# Patient Record
Sex: Male | Born: 2000 | Race: Black or African American | Hispanic: No | Marital: Single | Smoking: Current every day smoker
Health system: Southern US, Community
[De-identification: ages and names within clinical notes are randomized; demographics above are authoritative.]

---
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
## 2018-08-11 ENCOUNTER — Encounter (HOSPITAL_COMMUNITY): Payer: Self-pay

## 2018-08-11 ENCOUNTER — Emergency Department (HOSPITAL_COMMUNITY)
Admission: EM | Admit: 2018-08-11 | Discharge: 2018-08-12 | Disposition: A | Discharge status: Home / Self Care | Payer: Medicaid Other | Attending: Emergency Medicine | Admitting: Emergency Medicine

## 2018-08-11 DIAGNOSIS — Y92524 Gas station as the place of occurrence of the external cause: Secondary | ICD-10-CM | POA: Diagnosis not present

## 2018-08-11 DIAGNOSIS — Y999 Unspecified external cause status: Secondary | ICD-10-CM | POA: Insufficient documentation

## 2018-08-11 DIAGNOSIS — S01412A Laceration without foreign body of left cheek and temporomandibular area, initial encounter: Secondary | ICD-10-CM | POA: Insufficient documentation

## 2018-08-11 DIAGNOSIS — S0181XA Laceration without foreign body of other part of head, initial encounter: Secondary | ICD-10-CM

## 2018-08-11 DIAGNOSIS — Y939 Activity, unspecified: Secondary | ICD-10-CM | POA: Diagnosis not present

## 2018-08-11 DIAGNOSIS — S01112A Laceration without foreign body of left eyelid and periocular area, initial encounter: Secondary | ICD-10-CM | POA: Diagnosis present
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
## 2018-08-12 MED ORDER — TETANUS-DIPHTH-ACELL PERTUSSIS 5-2.5-18.5 LF-MCG/0.5 IM SUSP
0.5000 mL | Freq: Once | INTRAMUSCULAR | Status: AC
  Administered 2018-08-12: 0.5 mL via INTRAMUSCULAR
  Filled 2018-08-12: qty 0.5

## 2018-08-12 MED ORDER — LIDOCAINE-EPINEPHRINE (PF) 2 %-1:200000 IJ SOLN
20.0000 mL | Freq: Once | INTRAMUSCULAR | Status: AC
  Administered 2018-08-12: 20 mL
  Filled 2018-08-12: qty 20

## 2018-08-12 NOTE — Discharge Instructions (Addendum)
Keep wounds clean and watch for signs of infection such as spreading redness, pus draining, fevers. Use Tylenol, Motrin and ice as needed for pain and swelling.  Your swelling will likely be worse tomorrow and then should gradually get better. You can shower however be gentle around the stitches.  No baths or swimming pool until healed.
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
## 2018-11-30 IMAGING — CR DG CERVICAL SPINE COMPLETE 4+V
6 series · 6 of 6 positions shown · non-contrast
Comparison: None.

CLINICAL DATA: Back and neck pain. Heavy object dropped on back of
neck yesterday.

EXAM:
CERVICAL SPINE - COMPLETE 4+ VIEW

[c-spine lat]
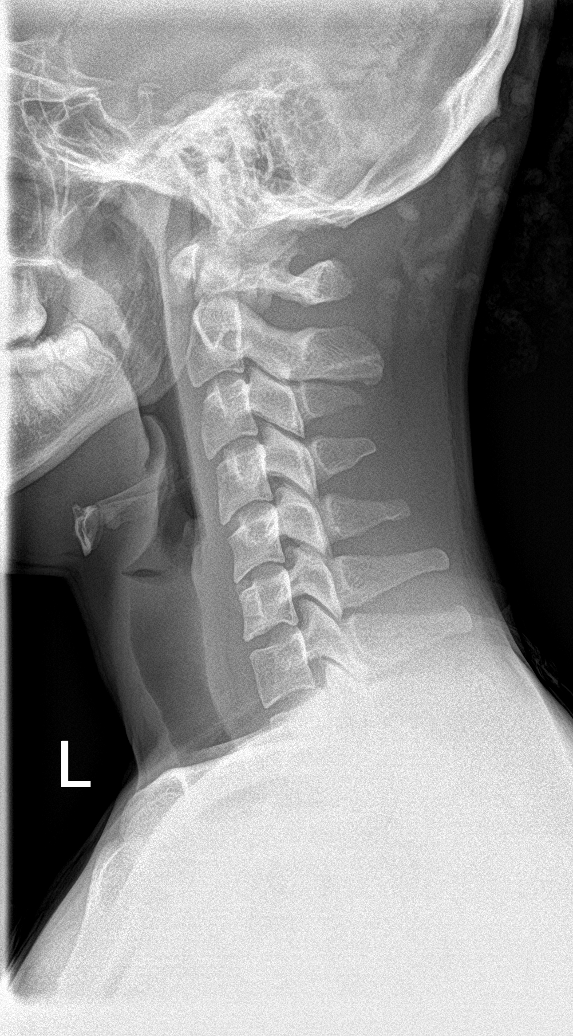

[c-spine obl (1 of 3)]
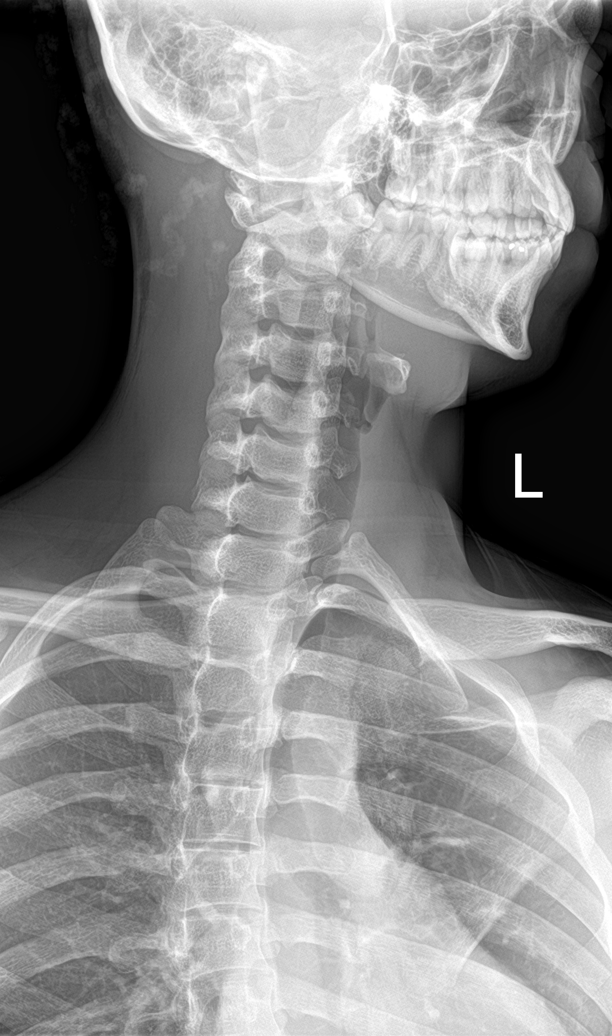

[c-spine obl (2 of 3)]
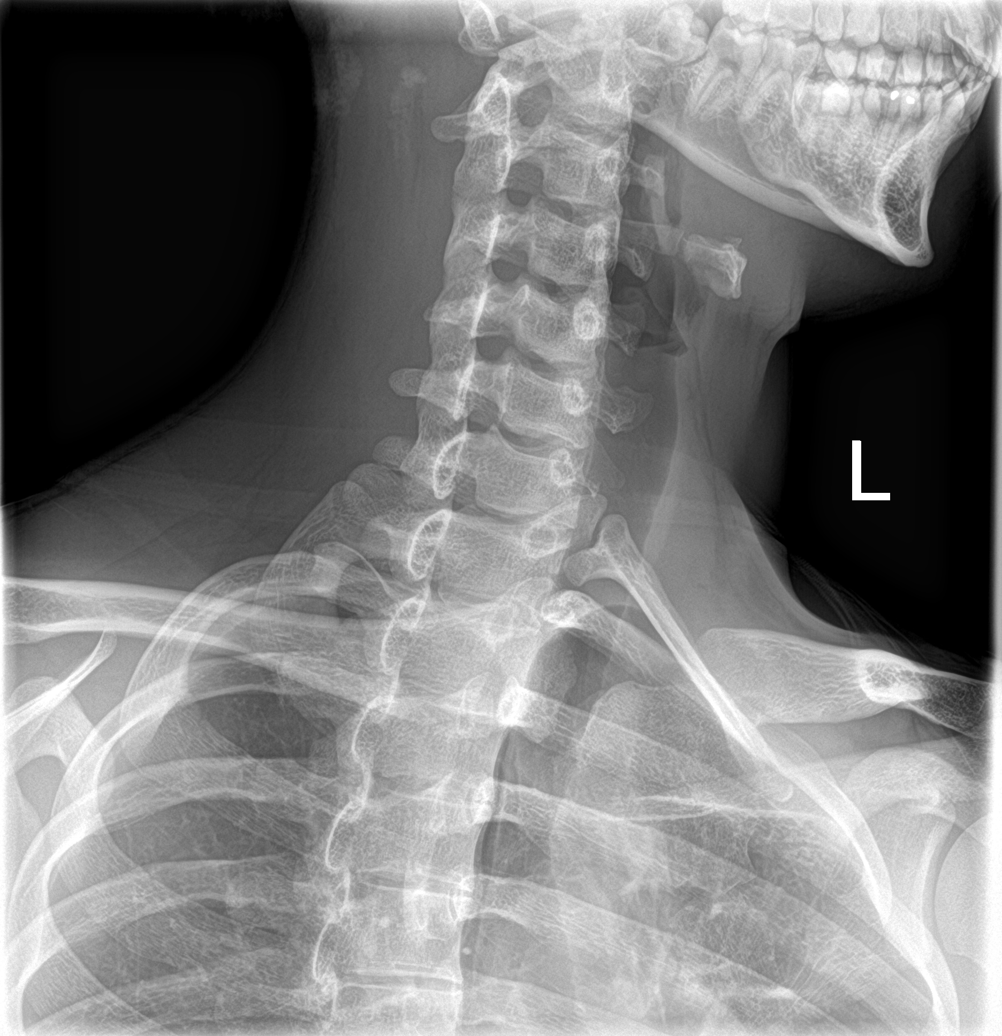

[c-spine ap]
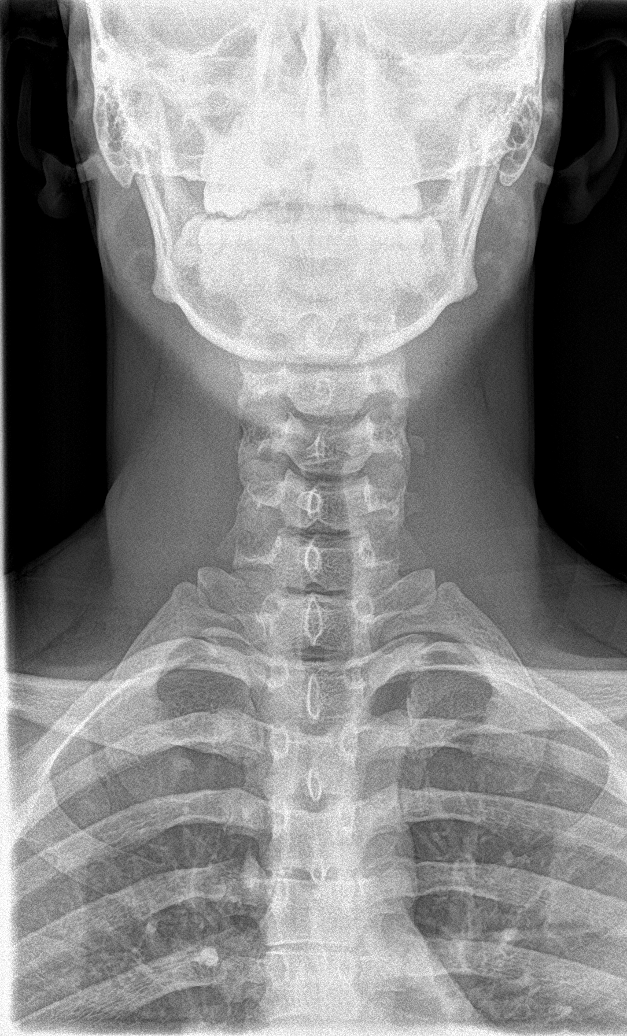

[c-spine obl (3 of 3)]
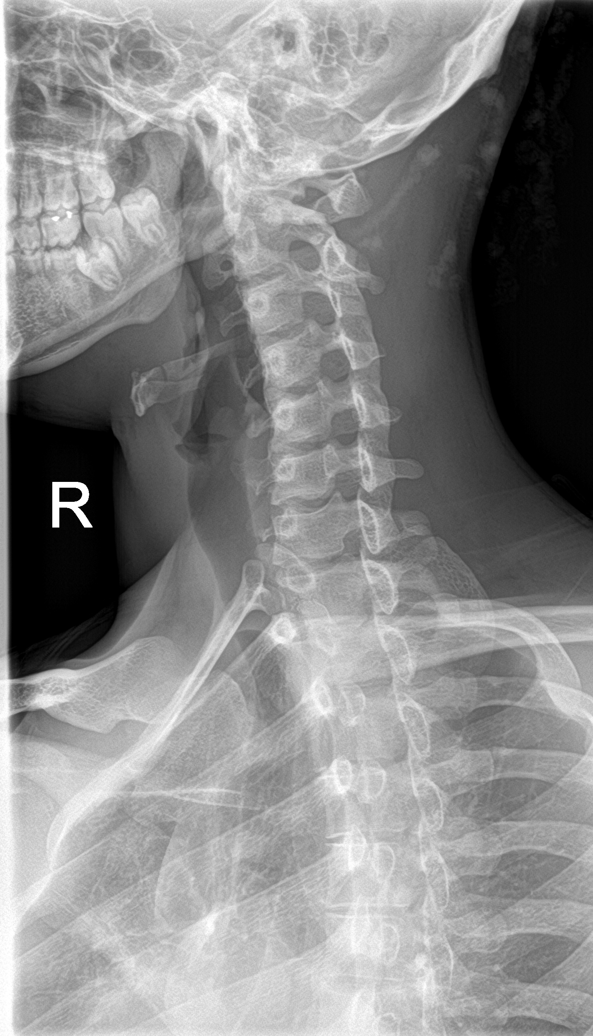

[c-spine open mouth]
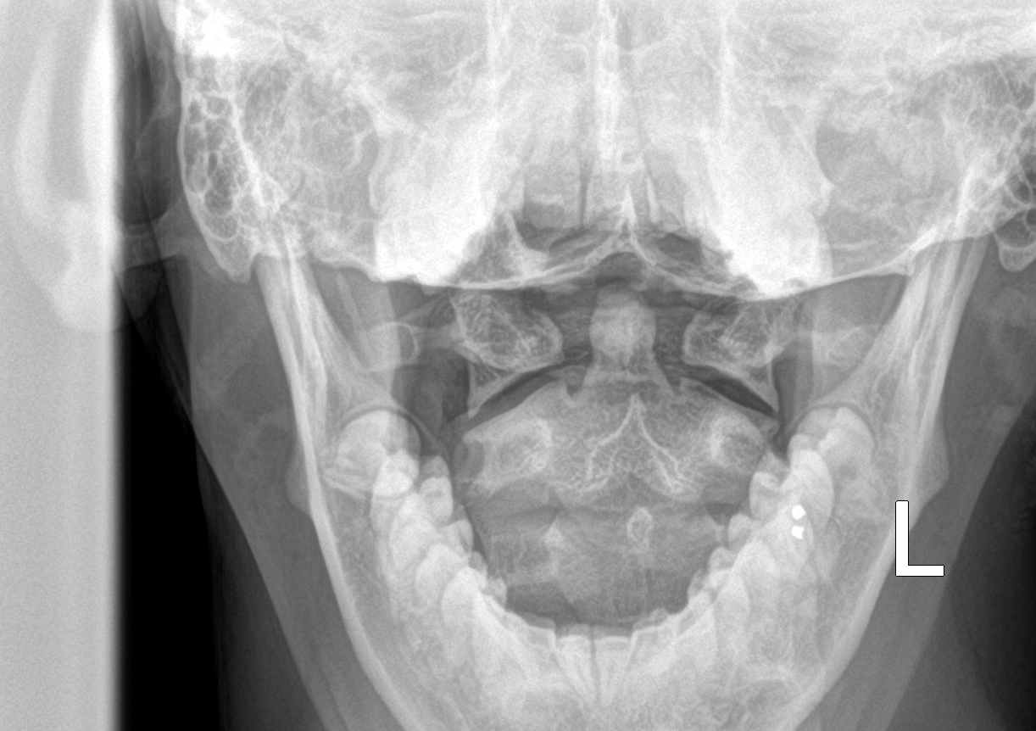

[6 of 6 positions shown; findings below may reference images not displayed]

FINDINGS: There is no evidence of cervical spine fracture or prevertebral soft
tissue swelling. Alignment is normal. No other significant bone
abnormalities are identified.
IMPRESSION: Negative cervical spine radiographs.

## 2019-07-15 ENCOUNTER — Other Ambulatory Visit: Payer: Medicaid Other

## 2020-04-20 ENCOUNTER — Other Ambulatory Visit: Payer: Self-pay

## 2020-04-20 ENCOUNTER — Ambulatory Visit (HOSPITAL_COMMUNITY)
Admission: EM | Admit: 2020-04-20 | Discharge: 2020-04-20 | Disposition: A | Discharge status: Home / Self Care | Payer: Medicaid Other | Attending: Family Medicine | Admitting: Family Medicine

## 2020-04-20 ENCOUNTER — Encounter (HOSPITAL_COMMUNITY): Payer: Self-pay | Admitting: *Deleted

## 2020-04-20 DIAGNOSIS — Z202 Contact with and (suspected) exposure to infections with a predominantly sexual mode of transmission: Secondary | ICD-10-CM | POA: Insufficient documentation

## 2020-04-20 MED ORDER — AZITHROMYCIN 250 MG PO TABS
ORAL_TABLET | ORAL | Status: AC
  Filled 2020-04-20: qty 4

## 2020-04-20 MED ORDER — AZITHROMYCIN 250 MG PO TABS
1000.0000 mg | ORAL_TABLET | Freq: Once | ORAL | Status: AC
  Administered 2020-04-20: 17:00:00 1000 mg via ORAL

## 2020-04-20 NOTE — ED Provider Notes (Signed)
[facility]   [id number] 04/20/20 Arrival Time: 1332  ASSESSMENT & PLAN:  1. Exposure to STD    Declines empiric gonorrhea treatment. Declines HIV/RPR>   Discharge Instructions      You have been given the following today for treatment of possible chlamydia:  Meds ordered this encounter  Medications  . azithromycin (ZITHROMAX) tablet 1,000 mg     Even though we have treated you today, we have sent testing for sexually transmitted infections. We will notify you of any positive results once they are received. If required, we will prescribe any medications you might need.  Please refrain from all sexual activity for at least the next seven days.     Pending: Labs Reviewed  CYTOLOGY, (ORAL, ANAL, URETHRAL) ANCILLARY ONLY    Will notify of any positive results. Instructed to refrain from sexual activity for at least seven days.  Reviewed expectations re: course of current medical issues. Questions answered. Outlined signs and symptoms indicating need for more acute intervention. Patient verbalized understanding. After Visit Summary given.   SUBJECTIVE:  Nathan Gardner is a 19 y.o. male who reports possible exposure to chlamydia. Male sexual partner recently tested positive. He reports no symptoms except for "maybe a little itching" of penis. Afebrile. Normal urination.   OBJECTIVE:  Vitals:   04/20/20 1636  BP: 121/70  Pulse: (!) 56  Resp: 16  Temp: 98.4 F (36.9 C)  TempSrc: Oral  SpO2: 98%     General appearance: alert, cooperative, appears stated age and no distress Lungs: unlabored respirations; speaks full sentences without difficulty Abdomen: soft, non-tender GU: deferred Skin: warm and dry Psychological: alert and cooperative; normal mood and affect.  No results found for this or any previous visit.  Labs Reviewed  CYTOLOGY, (ORAL, ANAL, URETHRAL) ANCILLARY ONLY    Allergies  Allergen Reactions  . Shellfish Allergy Itching     Throat irritation    History reviewed. No pertinent past medical history. Family History  Problem Relation Age of Onset  . Healthy Mother   . Healthy Father    Social History   Socioeconomic History  . Marital status: Single    Spouse name: Not on file  . Number of children: Not on file  . Years of education: Not on file  . Highest education level: Not on file  Occupational History  . Not on file  Tobacco Use  . Smoking status: Current Every Day Smoker    Packs/day: 0.50    Types: Cigarettes, Cigars  . Smokeless tobacco: Never Used  Vaping Use  . Vaping Use: Former  Substance and Sexual Activity  . Alcohol use: Yes    Comment: occ  . Drug use: No  . Sexual activity: Yes  Other Topics Concern  . Not on file  Social History Narrative  . Not on file   Social Determinants of Health   Financial Resource Strain:   . Difficulty of Paying Living Expenses: Not on file  Food Insecurity:   . Worried About Programme researcher, broadcasting/film/video in the Last Year: Not on file  . Ran Out of Food in the Last Year: Not on file  Transportation Needs:   . Lack of Transportation (Medical): Not on file  . Lack of Transportation (Non-Medical): Not on file  Physical Activity:   . Days of Exercise per Week: Not on file  . Minutes of Exercise per Session: Not on file  Stress:   . Feeling of Stress : Not on file  Social  Connections:   . Frequency of Communication with Friends and Family: Not on file  . Frequency of Social Gatherings with Friends and Family: Not on file  . Attends Religious Services: Not on file  . Active Member of Clubs or Organizations: Not on file  . Attends Banker Meetings: Not on file  . Marital Status: Not on file  Intimate Partner Violence:   . Fear of Current or Ex-Partner: Not on file  . Emotionally Abused: Not on file  . Physically Abused: Not on file  . Sexually Abused: Not on file          Mardella Layman, MD 04/20/20 1730

## 2020-04-20 NOTE — Discharge Instructions (Signed)
You have been given the following today for treatment of possible chlamydia:  Meds ordered this encounter  Medications   azithromycin (ZITHROMAX) tablet 1,000 mg    Even though we have treated you today, we have sent testing for sexually transmitted infections. We will notify you of any positive results once they are received. If required, we will prescribe any medications you might need.  Please refrain from all sexual activity for at least the next seven days.  

## 2020-04-20 NOTE — ED Triage Notes (Signed)
Patient does not have a cell phone. Patient's mom can be called at  [phone] in order to reach the patient.

## 2020-04-20 NOTE — ED Triage Notes (Signed)
Patient in requesting STD testing. Patient states that he experienced some itching to penis for the past couple of days. Patient thought it could be due to new hair growing back after shaving. Patient states that his girlfriend tested positive for chlamydia and found out on yesterday. Patient denies any fever or penile discharge.

## 2020-04-22 LAB — CYTOLOGY, (ORAL, ANAL, URETHRAL) ANCILLARY ONLY
Chlamydia: POSITIVE — AB
Comment: NEGATIVE
Comment: NEGATIVE
Comment: NORMAL
Neisseria Gonorrhea: NEGATIVE
Trichomonas: NEGATIVE

## 2020-05-09 ENCOUNTER — Telehealth (HOSPITAL_COMMUNITY): Payer: Self-pay | Admitting: Emergency Medicine

## 2020-05-09 NOTE — Telephone Encounter (Signed)
Patient returned call for results after receiving letter.  Updated phone number in chart.  Patient updated on recent STI results.  Reviewed safe sex practices, notifying partners.  Patient verified understanding, all questions answered.

## 2023-04-14 ENCOUNTER — Emergency Department (HOSPITAL_COMMUNITY): Payer: Medicaid Other

## 2023-04-14 ENCOUNTER — Other Ambulatory Visit: Payer: Self-pay

## 2023-04-14 ENCOUNTER — Emergency Department (HOSPITAL_COMMUNITY)
Admission: EM | Admit: 2023-04-14 | Discharge: 2023-04-14 | Disposition: A | Discharge status: Home / Self Care | Payer: Medicaid Other | Attending: Emergency Medicine | Admitting: Emergency Medicine

## 2023-04-14 ENCOUNTER — Encounter (HOSPITAL_COMMUNITY): Payer: Self-pay | Admitting: Emergency Medicine

## 2023-04-14 DIAGNOSIS — E860 Dehydration: Secondary | ICD-10-CM | POA: Diagnosis not present

## 2023-04-14 DIAGNOSIS — X58XXXA Exposure to other specified factors, initial encounter: Secondary | ICD-10-CM | POA: Diagnosis not present

## 2023-04-14 DIAGNOSIS — F10120 Alcohol abuse with intoxication, uncomplicated: Secondary | ICD-10-CM | POA: Insufficient documentation

## 2023-04-14 DIAGNOSIS — Y907 Blood alcohol level of 200-239 mg/100 ml: Secondary | ICD-10-CM | POA: Diagnosis not present

## 2023-04-14 DIAGNOSIS — S0990XA Unspecified injury of head, initial encounter: Secondary | ICD-10-CM | POA: Insufficient documentation

## 2023-04-14 DIAGNOSIS — F10920 Alcohol use, unspecified with intoxication, uncomplicated: Secondary | ICD-10-CM

## 2023-04-14 LAB — COMPREHENSIVE METABOLIC PANEL
ALT: 23 U/L (ref 0–44)
AST: 38 U/L (ref 15–41)
Albumin: 4.3 g/dL (ref 3.5–5.0)
Alkaline Phosphatase: 66 U/L (ref 38–126)
Anion gap: 8 (ref 5–15)
BUN: 13 mg/dL (ref 6–20)
CO2: 26 mmol/L (ref 22–32)
Calcium: 9.4 mg/dL (ref 8.9–10.3)
Chloride: 106 mmol/L (ref 98–111)
Creatinine, Ser: 1.17 mg/dL (ref 0.61–1.24)
GFR, Estimated: >60 mL/min (ref >60)
Glucose, Bld: 96 mg/dL (ref 70–99)
Potassium: 4.9 mmol/L (ref 3.5–5.1)
Sodium: 140 mmol/L (ref 135–145)
Total Bilirubin: 0.7 mg/dL (ref 0.3–1.2)
Total Protein: 7.2 g/dL (ref 6.5–8.1)

## 2023-04-14 LAB — RAPID URINE DRUG SCREEN, HOSP PERFORMED
Amphetamines: NOT DETECTED
Barbiturates: NOT DETECTED
Benzodiazepines: NOT DETECTED
Cocaine: NOT DETECTED
Opiates: NOT DETECTED
Tetrahydrocannabinol: POSITIVE — AB

## 2023-04-14 LAB — URINALYSIS, ROUTINE W REFLEX MICROSCOPIC
Bilirubin Urine: NEGATIVE
Glucose, UA: NEGATIVE mg/dL
Hgb urine dipstick: NEGATIVE
Ketones, ur: NEGATIVE mg/dL
Leukocytes,Ua: NEGATIVE
Nitrite: NEGATIVE
Protein, ur: NEGATIVE mg/dL
Specific Gravity, Urine: 1.02 (ref 1.005–1.030)
pH: 5.5 (ref 5.0–8.0)

## 2023-04-14 LAB — CBC WITH DIFFERENTIAL/PLATELET
Abs Immature Granulocytes: 0.01 10*3/uL (ref 0.00–0.07)
Basophils Absolute: 0 10*3/uL (ref 0.0–0.1)
Basophils Relative: 0 %
Eosinophils Absolute: 0 10*3/uL (ref 0.0–0.5)
Eosinophils Relative: 1 %
HCT: 44.6 % (ref 39.0–52.0)
Hemoglobin: 14 g/dL (ref 13.0–17.0)
Immature Granulocytes: 0 %
Lymphocytes Relative: 43 %
Lymphs Abs: 1.5 10*3/uL (ref 0.7–4.0)
MCH: 25 pg — ABNORMAL LOW (ref 26.0–34.0)
MCHC: 31.4 g/dL (ref 30.0–36.0)
MCV: 79.6 fL — ABNORMAL LOW (ref 80.0–100.0)
Monocytes Absolute: 0.3 10*3/uL (ref 0.1–1.0)
Monocytes Relative: 8 %
Neutro Abs: 1.6 10*3/uL — ABNORMAL LOW (ref 1.7–7.7)
Neutrophils Relative %: 48 %
Platelets: 188 10*3/uL (ref 150–400)
RBC: 5.6 MIL/uL (ref 4.22–5.81)
RDW: 11.6 % (ref 11.5–15.5)
WBC: 3.4 10*3/uL — ABNORMAL LOW (ref 4.0–10.5)
nRBC: 0 % (ref 0.0–0.2)

## 2023-04-14 LAB — ETHANOL: Alcohol, Ethyl (B): 235 mg/dL — ABNORMAL HIGH (ref <10)

## 2023-04-14 LAB — LIPASE, BLOOD: Lipase: 23 U/L (ref 11–51)

## 2023-04-14 MED ORDER — LACTATED RINGERS IV BOLUS
2000.0000 mL | Freq: Once | INTRAVENOUS | Status: AC
  Administered 2023-04-14: 2000 mL via INTRAVENOUS

## 2023-04-14 MED ORDER — ONDANSETRON HCL 4 MG PO TABS: 4.0000 mg | ORAL_TABLET | Freq: Three times a day (TID) | ORAL | 0 refills | Status: AC | PRN

## 2023-04-14 MED ORDER — KETOROLAC TROMETHAMINE 15 MG/ML IJ SOLN
15.0000 mg | Freq: Once | INTRAMUSCULAR | Status: AC
  Administered 2023-04-14: 15 mg via INTRAVENOUS
  Filled 2023-04-14: qty 1

## 2023-04-14 MED ORDER — ONDANSETRON HCL 4 MG/2ML IJ SOLN
4.0000 mg | Freq: Once | INTRAMUSCULAR | Status: AC
  Administered 2023-04-14: 4 mg via INTRAVENOUS
  Filled 2023-04-14: qty 2

## 2023-04-14 MED ORDER — ACETAMINOPHEN 500 MG PO TABS: 1000.0000 mg | ORAL_TABLET | Freq: Once | ORAL | Status: DC

## 2023-04-14 NOTE — ED Notes (Signed)
Let me know when family is at bedside please   Tonette Lederer, PA-C 04/14/23 5621

## 2023-04-14 NOTE — ED Notes (Signed)
Pt stood and ambulated well. Pt returned to be safely.

## 2023-04-14 NOTE — ED Provider Notes (Signed)
[place] EMERGENCY DEPARTMENT AT [facility] Provider Note   CSN: [id number] Arrival date & time: 04/14/23  6387     History  Chief Complaint  Patient presents with   Alcohol Intoxication    Nathan Gardner is a 22 y.o. male with no past medical history who presents to the ED due to alcohol intoxication.  Patient brought to the ED for uncontrollable nausea and vomiting.  Reports that he drank half of a handle of an unknown liquor throughout the night.  Denies pain on initial assessment but history limited secondary to acute alcohol intoxication.  States that he was feeling well prior to alcohol consumption.       Home Medications No daily medications  Allergies    Shellfish allergy    Review of Systems   Review of Systems  Unable to perform ROS: Other    Physical Exam Updated Vital Signs BP 112/65   Pulse (!) 56   Temp 98.5 F (36.9 C)   Resp 17   Ht 5\' 4"  (1.626 m)   Wt 64.4 kg   SpO2 100%   BMI 24.37 kg/m  Physical Exam Vitals and nursing note reviewed.  Constitutional:      General: He is not in acute distress.    Appearance: He is not toxic-appearing or diaphoretic.     Comments: Smells heavily of vodka  HENT:     Head: Normocephalic and atraumatic.     Mouth/Throat:     Mouth: Mucous membranes are dry.  Eyes:     General: No scleral icterus.    Extraocular Movements: Extraocular movements intact.     Conjunctiva/sclera: Conjunctivae normal.     Pupils: Pupils are equal, round, and reactive to light.  Cardiovascular:     Rate and Rhythm: Normal rate and regular rhythm.     Heart sounds: No murmur heard. Pulmonary:     Effort: Pulmonary effort is normal. No respiratory distress.     Breath sounds: Normal breath sounds.  Abdominal:     General: Abdomen is flat. There is no distension.     Palpations: Abdomen is soft.     Tenderness: There is no abdominal tenderness. There is no right CVA tenderness, left CVA tenderness, guarding or  rebound.  Musculoskeletal:        General: Normal range of motion.     Cervical back: Neck supple.     Right lower leg: No edema.     Left lower leg: No edema.  Skin:    General: Skin is warm and dry.     Capillary Refill: Capillary refill takes less than 2 seconds.     Coloration: Skin is not jaundiced or pale.     Findings: No rash.  Neurological:     Mental Status: He is alert.     Comments: Alert but slow to respond to questions though does answer most appropriately, no focal deficits  Psychiatric:        Mood and Affect: Affect is flat.        Speech: Speech is delayed.        Behavior: Behavior is cooperative.     ED Results / Procedures / Treatments   Labs (all labs ordered are listed, but only abnormal results are displayed) Labs Reviewed  CBC WITH DIFFERENTIAL/PLATELET - Abnormal; Notable for the following components:      Result Value   WBC 3.4 (*)    MCV 79.6 (*)    MCH 25.0 (*)  Neutro Abs 1.6 (*)    All other components within normal limits  RAPID URINE DRUG SCREEN, HOSP PERFORMED - Abnormal; Notable for the following components:   Tetrahydrocannabinol POSITIVE (*)    All other components within normal limits  ETHANOL - Abnormal; Notable for the following components:   Alcohol, Ethyl (B) 235 (*)    All other components within normal limits  COMPREHENSIVE METABOLIC PANEL  URINALYSIS, ROUTINE W REFLEX MICROSCOPIC  LIPASE, BLOOD    EKG EKG Interpretation Date/Time:  Sunday April 14 2023 09:39:03 EDT Ventricular Rate:  74 PR Interval:  140 QRS Duration:  84 QT Interval:  372 QTC Calculation: 412 R Axis:   73  Text Interpretation: Normal sinus rhythm with sinus arrhythmia Normal ECG No previous ECGs available Confirmed by Kristine Royal [phone]) on 04/14/2023 10:16:35 AM  Radiology DG Chest 2 View  Result Date: 04/14/2023 CLINICAL DATA:  Altered mental status.  Alcohol intoxication. EXAM: CHEST - 2 VIEW COMPARISON:  None Available. FINDINGS: The  heart size and mediastinal contours are within normal limits. Both lungs are clear. The visualized skeletal structures are unremarkable. IMPRESSION: No active cardiopulmonary disease. Electronically Signed   By: Kennith Center M.D.   On: 04/14/2023 11:28   CT Head Wo Contrast  Result Date: 04/14/2023 CLINICAL DATA:  Neck trauma, dangerous injury mechanism (Age 79-64y); Head trauma, moderate-severe Mental status change, unknown cause EXAM: CT HEAD WITHOUT CONTRAST TECHNIQUE: Contiguous axial images were obtained from the base of the skull through the vertex without intravenous contrast. RADIATION DOSE REDUCTION: This exam was performed according to the departmental dose-optimization program which includes automated exposure control, adjustment of the mA and/or kV according to patient size and/or use of iterative reconstruction technique. COMPARISON:  None Available. FINDINGS: Brain: No evidence of acute infarction, hemorrhage, hydrocephalus, extra-axial collection or mass lesion/mass effect. Vascular: No hyperdense vessel or unexpected calcification. Skull: Normal. Negative for fracture or focal lesion. Sinuses/Orbits: Mucoperiosteal thickening noted consistent with chronic bilateral maxillary sinusitis. IMPRESSION: No acute intracranial process. EXAM: CT CERVICAL SPINE WITHOUT CONTRAST TECHNIQUE: Multidetector CT imaging of the cervical spine was performed without intravenous contrast. Multiplanar CT image reconstructions were also generated. RADIATION DOSE REDUCTION: This exam was performed according to the departmental dose-optimization program which includes automated exposure control, adjustment of the mA and/or kV according to patient size and/or use of iterative reconstruction technique. COMPARISON:  None Available. FINDINGS: Alignment: Normal. Skull base and vertebrae: No acute fracture. No primary bone lesion or focal pathologic process. Soft tissues and spinal canal: No prevertebral fluid or swelling. No  visible canal hematoma. Disc levels:  Maintained Upper chest: Negative. IMPRESSION: Unremarkable study. Electronically Signed   By: Layla Maw M.D.   On: 04/14/2023 10:45   CT Cervical Spine Wo Contrast  Result Date: 04/14/2023 CLINICAL DATA:  Neck trauma, dangerous injury mechanism (Age 65-64y); Head trauma, moderate-severe Mental status change, unknown cause EXAM: CT HEAD WITHOUT CONTRAST TECHNIQUE: Contiguous axial images were obtained from the base of the skull through the vertex without intravenous contrast. RADIATION DOSE REDUCTION: This exam was performed according to the departmental dose-optimization program which includes automated exposure control, adjustment of the mA and/or kV according to patient size and/or use of iterative reconstruction technique. COMPARISON:  None Available. FINDINGS: Brain: No evidence of acute infarction, hemorrhage, hydrocephalus, extra-axial collection or mass lesion/mass effect. Vascular: No hyperdense vessel or unexpected calcification. Skull: Normal. Negative for fracture or focal lesion. Sinuses/Orbits: Mucoperiosteal thickening noted consistent with chronic bilateral maxillary sinusitis. IMPRESSION: No acute intracranial process.  EXAM: CT CERVICAL SPINE WITHOUT CONTRAST TECHNIQUE: Multidetector CT imaging of the cervical spine was performed without intravenous contrast. Multiplanar CT image reconstructions were also generated. RADIATION DOSE REDUCTION: This exam was performed according to the departmental dose-optimization program which includes automated exposure control, adjustment of the mA and/or kV according to patient size and/or use of iterative reconstruction technique. COMPARISON:  None Available. FINDINGS: Alignment: Normal. Skull base and vertebrae: No acute fracture. No primary bone lesion or focal pathologic process. Soft tissues and spinal canal: No prevertebral fluid or swelling. No visible canal hematoma. Disc levels:  Maintained Upper chest:  Negative. IMPRESSION: Unremarkable study. Electronically Signed   By: Layla Maw M.D.   On: 04/14/2023 10:45    Procedures Procedures    Medications Ordered in ED Medications  acetaminophen (TYLENOL) tablet 1,000 mg (1,000 mg Oral Not Given 04/14/23 1257)  lactated ringers bolus 2,000 mL (0 mLs Intravenous Stopped 04/14/23 1354)  ondansetron (ZOFRAN) injection 4 mg (4 mg Intravenous Given 04/14/23 0950)  ketorolac (TORADOL) 15 MG/ML injection 15 mg (15 mg Intravenous Given 04/14/23 1257)    ED Course/ Medical Decision Making/ A&P Clinical Course as of 04/14/23 1434  Sun Apr 14, 2023  9147 Patient told nursing staff that he is not here for vomiting.  Denies any episodes of vomiting.  Stated that he is "strong" and denies any acute complaints.  Stated that he was unsure why he was brought to the ED.  Family on the way.  Will continue to reassess and attempt to obtain further history from family once at bedside. [MG]  90 Mother and grandmother are now at bedside.  They state that patient was at his partner's house last night and drinking.  State that they were contacted in the middle of the night stating that patient was drunk and vomiting.  They went to pick him up this morning and found patient lying in the bathtub, confused, not behaving like himself, not able to walk.  State that they are concerned that something else is going on apart from alcohol intoxication.  States that they were told by patient's partner that sometime during the night he fell down and hit his head.  [MG]  1125 Patient much more alert, answering questions appropriately.  Complaining of a headache.  Suspect from hangover.  CT brain and C-spine normal.  Thus far labs unremarkable.  Brother at bedside.  Discussed with reassuring workup likelihood patient can be discharged once he metabolizes to freedom and patient and brother are agreeable.  Pending chest x-ray. [MG]  1340 Patient rechecked.  Family at bedside.  Still  feeling sleepy but states that lightheadedness has resolved.  No active emesis in the ED.  Has received over a liter of fluids.  Tolerating p.o.  Will have nursing staff attempt to ambulate and if patient able to ambulate okay will discharge home in the care of family. [MG]    Clinical Course User Index [MG] Tonette Lederer, PA-C                                Medical Decision Making Amount and/or Complexity of Data Reviewed Labs: ordered. Decision-making details documented in ED Course. Radiology: ordered.  Risk OTC drugs. Prescription drug management.   Medical Decision Making:   Zacheri Dalal is a 22 y.o. male who presented to the ED today with alcohol intoxication detailed above.     Complete initial physical exam performed, notably  the patient was in no acute distress, nontoxic-appearing.  Abdomen soft nontender.  Moderately dry mucous membranes. Reviewed and confirmed nursing documentation for past medical history, family history, social history.    Initial Assessment:   With the patient's presentation, differential diagnosis includes but is not limited to alcohol intoxication, dehydration, AKI, electrolyte disturbance, viral syndrome, psychiatric emergency.  This is most consistent with an acute complicated illness  Initial Plan:  Screening labs including CBC and Metabolic panel to evaluate for infectious or metabolic etiology of disease.  Urinalysis with reflex culture ordered to evaluate for UTI or relevant urologic/nephrologic pathology.  Lipase to evaluate for pancreatitis EKG to evaluate for cardiac pathology UDS and ethanol level to assess for acute intoxication Symptomatic management with fluids, antiemetics Objective evaluation as below reviewed   Initial Study Results:   Laboratory  All laboratory results reviewed without evidence of clinically relevant pathology.   Exceptions include: Alcohol 235, UDS positive for THC  EKG EKG was reviewed independently.   Normal sinus rhythm with sinus arrhythmia.  Normal intervals.  No acute ST-T changes.  No STEMI.  Normal EKG.  Radiology:  All images reviewed independently. Agree with radiology report at this time.   DG Chest 2 View  Result Date: 04/14/2023 CLINICAL DATA:  Altered mental status.  Alcohol intoxication. EXAM: CHEST - 2 VIEW COMPARISON:  None Available. FINDINGS: The heart size and mediastinal contours are within normal limits. Both lungs are clear. The visualized skeletal structures are unremarkable. IMPRESSION: No active cardiopulmonary disease. Electronically Signed   By: Kennith Center M.D.   On: 04/14/2023 11:28   CT Head Wo Contrast  Result Date: 04/14/2023 CLINICAL DATA:  Neck trauma, dangerous injury mechanism (Age 52-64y); Head trauma, moderate-severe Mental status change, unknown cause EXAM: CT HEAD WITHOUT CONTRAST TECHNIQUE: Contiguous axial images were obtained from the base of the skull through the vertex without intravenous contrast. RADIATION DOSE REDUCTION: This exam was performed according to the departmental dose-optimization program which includes automated exposure control, adjustment of the mA and/or kV according to patient size and/or use of iterative reconstruction technique. COMPARISON:  None Available. FINDINGS: Brain: No evidence of acute infarction, hemorrhage, hydrocephalus, extra-axial collection or mass lesion/mass effect. Vascular: No hyperdense vessel or unexpected calcification. Skull: Normal. Negative for fracture or focal lesion. Sinuses/Orbits: Mucoperiosteal thickening noted consistent with chronic bilateral maxillary sinusitis. IMPRESSION: No acute intracranial process. EXAM: CT CERVICAL SPINE WITHOUT CONTRAST TECHNIQUE: Multidetector CT imaging of the cervical spine was performed without intravenous contrast. Multiplanar CT image reconstructions were also generated. RADIATION DOSE REDUCTION: This exam was performed according to the departmental dose-optimization  program which includes automated exposure control, adjustment of the mA and/or kV according to patient size and/or use of iterative reconstruction technique. COMPARISON:  None Available. FINDINGS: Alignment: Normal. Skull base and vertebrae: No acute fracture. No primary bone lesion or focal pathologic process. Soft tissues and spinal canal: No prevertebral fluid or swelling. No visible canal hematoma. Disc levels:  Maintained Upper chest: Negative. IMPRESSION: Unremarkable study. Electronically Signed   By: Layla Maw M.D.   On: 04/14/2023 10:45   CT Cervical Spine Wo Contrast  Result Date: 04/14/2023 CLINICAL DATA:  Neck trauma, dangerous injury mechanism (Age 31-64y); Head trauma, moderate-severe Mental status change, unknown cause EXAM: CT HEAD WITHOUT CONTRAST TECHNIQUE: Contiguous axial images were obtained from the base of the skull through the vertex without intravenous contrast. RADIATION DOSE REDUCTION: This exam was performed according to the departmental dose-optimization program which includes automated exposure control, adjustment of  the mA and/or kV according to patient size and/or use of iterative reconstruction technique. COMPARISON:  None Available. FINDINGS: Brain: No evidence of acute infarction, hemorrhage, hydrocephalus, extra-axial collection or mass lesion/mass effect. Vascular: No hyperdense vessel or unexpected calcification. Skull: Normal. Negative for fracture or focal lesion. Sinuses/Orbits: Mucoperiosteal thickening noted consistent with chronic bilateral maxillary sinusitis. IMPRESSION: No acute intracranial process. EXAM: CT CERVICAL SPINE WITHOUT CONTRAST TECHNIQUE: Multidetector CT imaging of the cervical spine was performed without intravenous contrast. Multiplanar CT image reconstructions were also generated. RADIATION DOSE REDUCTION: This exam was performed according to the departmental dose-optimization program which includes automated exposure control, adjustment of  the mA and/or kV according to patient size and/or use of iterative reconstruction technique. COMPARISON:  None Available. FINDINGS: Alignment: Normal. Skull base and vertebrae: No acute fracture. No primary bone lesion or focal pathologic process. Soft tissues and spinal canal: No prevertebral fluid or swelling. No visible canal hematoma. Disc levels:  Maintained Upper chest: Negative. IMPRESSION: Unremarkable study. Electronically Signed   By: Layla Maw M.D.   On: 04/14/2023 10:45      Final Assessment and Plan:   22 year old male presents to the ED for evaluation.  Somewhat conflicting reports of what brought patient to the ED.  Initially believed to be nausea and family arrived and noted altered mental status.  Patient initially with delayed responses, smells of alcohol though was alert and oriented, answering questions appropriately and without any acute complaints.  No signs of trauma.  Family once they arrived concerned as patient's partner reported that he fell and hit his head.  Patient neurologically intact.  Pupils equal, round, and reactive to light.  No nystagmus.  No signs of head trauma.  With concern for confusion of unknown etiology discussed with family and patient and they are agreeable to proceed with imaging to rule out underlying emergent cause.  CT head and C-spine normal.  Patient given IV fluids and increasingly alert, oriented, and more responsive.  Labs essentially unremarkable apart from elevated alcohol level in the 200s and UDS positive for THC.  Chest x-ray normal.  Patient afebrile, nontoxic-appearing.  No active emesis in the ED.  Overwhelmingly reassuring workup, do suspect that patient's symptoms secondary to acute alcohol intoxication.  Patient eventually able to tolerate p.o., ambulatory, has family at bedside to transport him.  Discussed symptomatic management of symptoms at home, need for hangovers including headaches, nausea and patient will increase hydration using  water and electrolyte replacement solutions.  Discussed strict ED return precautions and patient/family agreeable.  All questions answered and patient stable for discharge.   Clinical Impression:  1. Alcoholic intoxication without complication (HCC)   2. Injury of head, initial encounter   3. Dehydration      Discharge           Final Clinical Impression(s) / ED Diagnoses Final diagnoses:  Alcoholic intoxication without complication (HCC)  Injury of head, initial encounter  Dehydration    Rx / DC Orders ED Discharge Orders          Ordered    ondansetron (ZOFRAN) 4 MG tablet  Every 8 hours PRN        04/14/23 1401              Tonette Lederer, PA-C 04/14/23 1434    Wynetta Fines, MD 04/14/23 1615

## 2023-04-14 NOTE — ED Notes (Signed)
To CT

## 2023-04-14 NOTE — Discharge Instructions (Signed)
Thank you for letting us take care of you today.  Your alcohol was very elevated in the 200s and your urine was positive for marijuana. This combination likely led to your symptoms today. Otherwise, there were no significant abnormalities in your labs or urine and the CT of your head/neck and chest x-ray were normal.  Please be sure to drink lots of fluids and electrolyte replacement solutions today to help hydrate yourself. You may take Tylenol or ibuprofen as needed for pain. I prescribed nausea medication as needed. Return to ED for worsening condition or emergent concerns.

## 2023-04-14 NOTE — ED Triage Notes (Signed)
Per [facility] pt picked up from girlfriends house after drinking "a handle" since last night. Family states they found patient in bathtub sleeping and called ems. Emesis and urine on patient.
